# Patient Record
Sex: Female | Born: 1985 | Race: White | Hispanic: No | Marital: Single
Health system: Southern US, Community
[De-identification: ages and names within clinical notes are randomized; demographics above are authoritative.]

## PROBLEM LIST (undated history)

## (undated) DIAGNOSIS — Z808 Family history of malignant neoplasm of other organs or systems: Secondary | ICD-10-CM

## (undated) DIAGNOSIS — Z8042 Family history of malignant neoplasm of prostate: Secondary | ICD-10-CM

## (undated) DIAGNOSIS — Z8543 Personal history of malignant neoplasm of ovary: Secondary | ICD-10-CM

## (undated) HISTORY — DX: Family history of malignant neoplasm of prostate: Z80.42

## (undated) HISTORY — DX: Personal history of malignant neoplasm of ovary: Z85.43

## (undated) HISTORY — DX: Family history of malignant neoplasm of other organs or systems: Z80.8

---
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
## 2003-04-11 ENCOUNTER — Ambulatory Visit (HOSPITAL_COMMUNITY): Admission: RE | Admit: 2003-04-11 | Discharge: 2003-04-11 | Payer: Self-pay | Admitting: *Deleted

## 2004-04-08 ENCOUNTER — Ambulatory Visit: Payer: Self-pay | Admitting: Pediatrics

## 2005-07-28 ENCOUNTER — Ambulatory Visit: Payer: Self-pay | Admitting: Pediatrics

## 2009-12-21 ENCOUNTER — Ambulatory Visit (HOSPITAL_COMMUNITY)
Admission: RE | Admit: 2009-12-21 | Discharge: 2009-12-21 | Payer: Self-pay | Source: Home / Self Care | Admitting: Specialist

## 2015-01-20 ENCOUNTER — Inpatient Hospital Stay
Admission: RE | Admit: 2015-01-20 | Discharge: 2015-01-20 | Disposition: A | Discharge status: Home / Self Care | Payer: Self-pay | Source: Ambulatory Visit | Attending: Otolaryngology | Admitting: Otolaryngology

## 2015-01-20 ENCOUNTER — Other Ambulatory Visit (HOSPITAL_COMMUNITY): Payer: Self-pay | Admitting: Otolaryngology

## 2015-01-20 DIAGNOSIS — J320 Chronic maxillary sinusitis: Secondary | ICD-10-CM

## 2017-09-14 ENCOUNTER — Encounter: Payer: Self-pay | Admitting: Genetics

## 2017-09-14 ENCOUNTER — Telehealth: Payer: Self-pay | Admitting: Genetics

## 2017-09-14 NOTE — Telephone Encounter (Signed)
Spoke to the pt's mother, Bonnita Nasuti,  who says it is their daughter that needs the genetic counseling instead of Mr. Rotz. Pt has been scheduled to see Ferol Luz on 9/5 at 3pm. Mrs. Ohagan has been made aware that her daughter should arrive 30 minutes early to be checked in on time. She voiced understanding.

## 2017-10-05 ENCOUNTER — Inpatient Hospital Stay: Payer: BLUE CROSS/BLUE SHIELD

## 2017-10-05 ENCOUNTER — Inpatient Hospital Stay: Payer: BLUE CROSS/BLUE SHIELD | Attending: Genetic Counselor | Admitting: Genetics

## 2017-10-05 DIAGNOSIS — Z806 Family history of leukemia: Secondary | ICD-10-CM | POA: Diagnosis not present

## 2017-10-05 DIAGNOSIS — Z8543 Personal history of malignant neoplasm of ovary: Secondary | ICD-10-CM

## 2017-10-05 DIAGNOSIS — Z808 Family history of malignant neoplasm of other organs or systems: Secondary | ICD-10-CM | POA: Diagnosis not present

## 2017-10-05 DIAGNOSIS — Z315 Encounter for genetic counseling: Secondary | ICD-10-CM

## 2017-10-05 DIAGNOSIS — Z8042 Family history of malignant neoplasm of prostate: Secondary | ICD-10-CM | POA: Diagnosis not present

## 2017-10-05 DIAGNOSIS — Z8371 Family history of colonic polyps: Secondary | ICD-10-CM

## 2017-10-06 ENCOUNTER — Encounter: Payer: Self-pay | Admitting: Genetics

## 2017-10-06 DIAGNOSIS — Z8543 Personal history of malignant neoplasm of ovary: Secondary | ICD-10-CM | POA: Insufficient documentation

## 2017-10-06 DIAGNOSIS — Z8042 Family history of malignant neoplasm of prostate: Secondary | ICD-10-CM | POA: Insufficient documentation

## 2017-10-06 DIAGNOSIS — Z808 Family history of malignant neoplasm of other organs or systems: Secondary | ICD-10-CM | POA: Insufficient documentation

## 2017-10-06 NOTE — Progress Notes (Signed)
REFERRING PROVIDER: Self-referral  OBGYN PROVIDER:  Selinda Flavin, MD Fax: [phone] [facility]  PRIMARY REASON FOR VISIT:  1. Family history of leukemia   2. Personal history of ovarian cancer   3. Family history of prostate cancer   4. Family history of melanoma     HISTORY OF PRESENT ILLNESS:   Anne Hodges, a 32 y.o. female, was seen for a [facility] cancer genetics consultation due to a personal and family history of cancer.  Anne Hodges presents to clinic today to discuss the possibility of a hereditary predisposition to cancer, genetic testing, and to further clarify her future cancer risks, as well as potential cancer risks for family members.   At the age of 35, Anne Hodges was diagnosed with ovarian cancer.  She did not have her pathology report, but reported it was 'mucinous' and she believes it was her right ovary. She had her right ovary and fallopian tube removed.   HORMONAL RISK FACTORS:  Menarche was at age 29.  First live birth at age N/A.  OCP useyes  Ovaries intact: R ovary and fallopian tube removed, left intact.   Hysterectomy: no.  Menopausal status: reports she has had menopausal symptoms since the removal of her ovary.  HRT use: 0 years. Colonoscopy: no; not examined. Mammogram within the last year: no. Number of breast biopsies: 0.  She reports she did have lump on the skin of her breast removed, but this was not cancerous and was on the skin not in the breast.   Past Medical History:  Diagnosis Date  . Family history of melanoma   . Family history of prostate cancer   . Personal history of ovarian cancer     Social History   Socioeconomic History  . Marital status: Single    Spouse name: Not on file  . Number of children: Not on file  . Years of education: Not on file  . Highest education level: Not on file  Occupational History  . Not on file  Social Needs  . Financial resource strain: Not on file  . Food insecurity:    Worry: Not on  file    Inability: Not on file  . Transportation needs:    Medical: Not on file    Non-medical: Not on file  Tobacco Use  . Smoking status: Not on file  Substance and Sexual Activity  . Alcohol use: Not on file  . Drug use: Not on file  . Sexual activity: Not on file  Lifestyle  . Physical activity:    Days per week: Not on file    Minutes per session: Not on file  . Stress: Not on file  Relationships  . Social connections:    Talks on phone: Not on file    Gets together: Not on file    Attends religious service: Not on file    Active member of club or organization: Not on file    Attends meetings of clubs or organizations: Not on file    Relationship status: Not on file  Other Topics Concern  . Not on file  Social History Narrative  . Not on file     FAMILY HISTORY:  We obtained a detailed, 4-generation family history.  Significant diagnoses are listed below: Family History  Problem Relation Age of Onset  . Colon polyps Mother        a few precancerous, colonocsopy every 5 years  . Prostate cancer Father 82  gleason 7  . Colon polyps Maternal Grandmother        a cople prencancerous polyps, had colonocsopy every 5 years  . Diabetes Maternal Grandfather   . Melanoma Other        metastatic  . Other Other        blood disorder, maybe Ca?  . Leukemia Other     Anne Hodges has no children.  She has a 24 year-old sister with no history of cancer.  Her sister has her uterus and ovaries intact and has no history of cancer.   Anne Hodges father: dx with prostate cancer at 20, it is now Gleason 7 and he is planning to have a prostatectomy Paternal Aunts/Uncles: none, father was only child Paternal cousins: N/A Paternal grandfather: no history of cancer, died in his 90's Paternal grandmother:died at 10 with no history of cancer.  She had a sister (patient's great aunt) who died of metastatic melanoma. She has another sister (pateint's great aunt) who had a blood  disorder, possibly cancer?  Anne Hodges mother: 64, no history of cancer.  She has her ovaries intact, but uterus removed.   She has had a few precancerous colon polyps and has colonoscopies every 5 years. Maternal Aunts/Uncles: 1 paternal uncle is 38 with no history of cancer. 1 paternal aunt is 26 with no history of cancer.  Maternal cousins: no history of cancer, 1 maternal cousin has early onset cirrhosis of the liver, he does have significant alcohol use.  Maternal grandfather: died at 16 due to complications of Diabetes.  His mother had leukemia.  Maternal grandmother:died at 21 with no history of cancer.  She had some colon polyps throughout her life, and had colonoscopies every 5 years.  She had an emergency hysterectomy at 53, but the patient does not know the reason for this.   Anne Hodges is unaware of previous family history of genetic testing for hereditary cancer risks. Patient's maternal ancestors are of [place] descent, and paternal ancestors are of [place] descent. There is no reported Ashkenazi Jewish ancestry. There is no known consanguinity.  GENETIC COUNSELING ASSESSMENT: Anne Hodges is a 32 y.o. female with a personal and family history which is somewhat suggestive of a Hereditary Cancer Predisposition Syndrome. We, therefore, discussed and recommended the following at today's visit.   DISCUSSION: We reviewed the characteristics, features and inheritance patterns of hereditary cancer syndromes. We also discussed genetic testing, including the appropriate family members to test, the process of testing, insurance coverage and turn-around-time for results. We discussed the implications of a negative, positive and/or variant of uncertain significant result. We recommended Anne Hodges pursue genetic testing for the Multi-Cancer gene panel.   The Multi-Cancer Panel offered by [facility] includes sequencing and/or deletion duplication testing of the following 91 genes: AIP, ALK, APC,  ATM, AXIN2, BAP1, BARD1, BLM, BMPR1A, BRCA1, BRCA2, BRIP1, BUB1B, CASR, CDC73, CDH1, CDK4, CDKN1B, CDKN1C, CDKN2A, CEBPA, CEP57, CHEK2, CTNNA1, DICER1, DIS3L2, EGFR, ENG, EPCAM, FH, FLCN, GALNT12, GATA2, GPC3, GREM1, HOXB13, HRAS, KIT, MAX, MEN1, MET, MITF, MLH1, MLH3, MSH2, MSH3, MSH6, MUTYH, NBN, NF1, NF2, NTHL1, PALB2, PDGFRA, PHOX2B, PMS2, POLD1, POLE, POT1, PRKAR1A, PTCH1, PTEN, RAD50, RAD51C, RAD51D, RB1, RECQL4, RET, RNF43, RPS20, RUNX1, SDHA, SDHAF2, SDHB, SDHC, SDHD, SMAD4, SMARCA4, SMARCB1, SMARCE1, STK11, SUFU, TERC, TERT, TMEM127, TP53, TSC1, TSC2, VHL, WRN, WT1  One of the most common hereditary cancer syndromes that increases ovarian cancer risk is called Hereditary Breast and Ovarian Cancer (HBOC) syndrome.  This syndrome is caused by  mutations in the BRCA1 and BRCA2 genes.  This syndrome increases an individual's lifetime risk to develop breast, ovarian, pancreatic, and other types of cancer.  There are also many other cancer predisposition syndromes caused by mutations in several other genes.  We discussed that if she is found to have a mutation in one of these genes, it may impact future medical management recommendations such as increased cancer screenings and consideration of risk reducing surgeries.  A positive result could also have implications for the patient's family members.  A Negative result would mean we were unable to identify a hereditary component to her cancer, but does not rule out the possibility of a hereditary basis for her cancer.  There could be mutations that are undetectable by current technology, or in genes not yet tested or identified to increase cancer risk.    We discussed the potential to find a Variant of Uncertain Significance or VUS.  These are variants that have not yet been identified as pathogenic or benign, and it is unknown if this variant is associated with increased cancer risk or if this is a normal finding.  Most VUS's are reclassified to benign or  likely benign.   It should not be used to make medical management decisions. With time, we suspect the lab will determine the significance of any VUS's identified if any.   Based on Anne Hodges's personal and family history of cancer, she meets medical criteria for genetic testing. Despite that she meets criteria, she may still have an out of pocket cost. The laboratory can provide her with an estimate of her OOP cost.  she was given the contact information for the laboratory if she has further questions.   We discussed that some people do not want to undergo genetic testing due to fear of genetic discrimination.  A federal law called the Genetic Information Non-Discrimination Act (GINA) of 2008 helps protect individuals against genetic discrimination based on their genetic test results.  It impacts both health insurance and employment.  For health insurance, it protects against increased premiums, being kicked off insurance or being forced to take a test in order to be insured.  For employment it protects against hiring, firing and promoting decisions based on genetic test results.  Health status due to a cancer diagnosis is not protected under GINA.  This law does not protect life insurance, disability insurance, or other types of insurance.   PLAN: After considering the risks, benefits, and limitations, Anne Hodges  provided informed consent to pursue genetic testing and the blood sample was sent to [facility] for analysis of the Multi-Cancer Panel. Results should be available within approximately 2-3 weeks' time, at which point they will be disclosed by telephone to Anne Hodges, as will any additional recommendations warranted by these results. Anne Hodges will receive a summary of her genetic counseling visit and a copy of her results once available. This information will also be available in Epic. We encouraged Anne Hodges to remain in contact with cancer genetics annually so that we can  continuously update the family history and inform her of any changes in cancer genetics and testing that may be of benefit for her family. Anne Hodges questions were answered to her satisfaction today. Our contact information was provided should additional questions or concerns arise.  Lastly, we encouraged Anne Hodges to remain in contact with cancer genetics annually so that we can continuously update the family history and inform her of any changes in cancer genetics and  testing that may be of benefit for this family.   Ms.  Hodges questions were answered to her satisfaction today. Our contact information was provided should additional questions or concerns arise. Thank you for the referral and allowing [place] to share in the care of your patient.   Tana Felts, MS, [facility] Certified Genetic Counselor [email or web address] phone: [phone]  The patient was seen for a total of 40 minutes in face-to-face genetic counseling.  The patient was accompanied today by her mother and father.  This patient was discussed with Drs. Magrinat, Lindi Adie and/or Burr Medico who agrees with the above.

## 2017-10-24 ENCOUNTER — Telehealth: Payer: Self-pay | Admitting: Genetics

## 2017-11-01 ENCOUNTER — Encounter: Payer: Self-pay | Admitting: Genetics

## 2017-11-01 ENCOUNTER — Ambulatory Visit: Payer: Self-pay | Admitting: Genetics

## 2017-11-01 DIAGNOSIS — Z8042 Family history of malignant neoplasm of prostate: Secondary | ICD-10-CM

## 2017-11-01 DIAGNOSIS — Z1379 Encounter for other screening for genetic and chromosomal anomalies: Secondary | ICD-10-CM | POA: Insufficient documentation

## 2017-11-01 DIAGNOSIS — Z8543 Personal history of malignant neoplasm of ovary: Secondary | ICD-10-CM

## 2017-11-01 DIAGNOSIS — Z806 Family history of leukemia: Secondary | ICD-10-CM

## 2017-11-01 DIAGNOSIS — Z808 Family history of malignant neoplasm of other organs or systems: Secondary | ICD-10-CM

## 2017-11-01 NOTE — Progress Notes (Signed)
HPI:  Anne Hodges was previously seen in the [place] clinic on 10/05/2017 due to a personal and family history of cancer and concerns regarding a hereditary predisposition to cancer. Please refer to our prior cancer genetics clinic note for more information regarding Anne Hodges's medical, social and family histories, and our assessment and recommendations, at the time. Anne Hodges recent genetic test results were disclosed to her, as well as recommendations warranted by these results. These results and recommendations are discussed in more detail below.  CANCER HISTORY:   No history exists.     FAMILY HISTORY:  We obtained a detailed, 4-generation family history.  Significant diagnoses are listed below: Family History  Problem Relation Age of Onset  . Colon polyps Mother        a few precancerous, colonocsopy every 5 years  . Prostate cancer Father 61       gleason 7  . Basal cell carcinoma Father        nose  . Colon polyps Maternal Grandmother        a cople prencancerous polyps, had colonocsopy every 5 years  . Diabetes Maternal Grandfather   . Melanoma Other        metastatic  . Other Other        blood disorder, maybe Ca?  . Leukemia Other     Anne Hodges has no children.  She has a 71 year-old sister with no history of cancer.  Her sister has her uterus and ovaries intact and has no history of cancer.   Anne Hodges father: dx with prostate cancer at 67, it is now Gleason 7 and he is planning to have a prostatectomy Paternal Aunts/Uncles: none, father was only child Paternal cousins: N/A Paternal grandfather: no history of cancer, died in his 73's Paternal grandmother:died at 67 with no history of cancer.  She had a sister (patient's great aunt) who died of metastatic melanoma. She has another sister (pateint's great aunt) who had a blood disorder, possibly cancer?  Anne Hodges mother: 4, no history of cancer.  She has her ovaries intact, but  uterus removed.   She has had a few precancerous colon polyps and has colonoscopies every 5 years. Maternal Aunts/Uncles: 1 paternal uncle is 12 with no history of cancer. 1 paternal aunt is 27 with no history of cancer.  Maternal cousins: no history of cancer, 1 maternal cousin has early onset cirrhosis of the liver, he does have significant alcohol use.  Maternal grandfather: died at 78 due to complications of Diabetes.  His mother had leukemia.  Maternal grandmother:died at 71 with no history of cancer.  She had some colon polyps throughout her life, and had colonoscopies every 5 years.  She had an emergency hysterectomy at 13, but the patient does not know the reason for this.   Anne Hodges is unaware of previous family history of genetic testing for hereditary cancer risks. Patient's maternal ancestors are of [place] descent, and paternal ancestors are of [place] descent. There is no reported Ashkenazi Jewish ancestry. There is no known consanguinity.  GENETIC TEST RESULTS: Genetic testing performed through [facility]'s Multi-Cancer Panel reported out on 10/21/2017 showed no pathogenic mutations. The Multi-Cancer Panel offered by [facility] includes sequencing and/or deletion duplication testing of the following 91 genes: AIP, ALK, APC, ATM, AXIN2, BAP1, BARD1, BLM, BMPR1A, BRCA1, BRCA2, BRIP1, BUB1B, CASR, CDC73, CDH1, CDK4, CDKN1B, CDKN1C, CDKN2A, CEBPA, CEP57, CHEK2, CTNNA1, DICER1, DIS3L2, EGFR, ENG, EPCAM, FH, FLCN, GALNT12, GATA2, GPC3, GREM1,  HOXB13, HRAS, KIT, MAX, MEN1, MET, MITF, MLH1, MLH3, MSH2, MSH3, MSH6, MUTYH, NBN, NF1, NF2, NTHL1, PALB2, PDGFRA, PHOX2B, PMS2, POLD1, POLE, POT1, PRKAR1A, PTCH1, PTEN, RAD50, RAD51C, RAD51D, RB1, RECQL4, RET, RNF43, RPS20, RUNX1, SDHA, SDHAF2, SDHB, SDHC, SDHD, SMAD4, SMARCA4, SMARCB1, SMARCE1, STK11, SUFU, TERC, TERT, TMEM127, TP53, TSC1, TSC2, VHL, WRN, WT1  The test report will be scanned into EPIC and will be located under the Molecular Pathology section of  the Results Review tab. A portion of the result report is included below for reference.     We discussed with Anne Hodges that because current genetic testing is not perfect, it is possible there may be a gene mutation in one of these genes that current testing cannot detect, but that chance is small.  We also discussed, that there could be another gene that has not yet been discovered, or that we have not yet tested, that is responsible for the cancer diagnoses in the family. It is also possible there is a hereditary cause for the cancer in the family that Anne Hodges did not inherit and therefore was not identified in her testing.  Therefore, it is important to remain in touch with cancer genetics in the future so that we can continue to offer Anne Hodges the most up to date genetic testing.    ADDITIONAL GENETIC TESTING: We discussed with Anne Hodges that her genetic testing was fairly extensive.  If there are are genes identified to increase cancer risk that can be analyzed in the future, we would be happy to discuss and coordinate this testing at that time.    CANCER SCREENING RECOMMENDATIONS: Anne Hodges test result is considered negative (normal).  This means that we have not identified a hereditary cause for her personal and family history of cancer at this time.   While reassuring, this does not definitively rule out a hereditary predisposition to cancer. It is still possible that there could be genetic mutations that are undetectable by current technology, or genetic mutations in genes that have not been tested or identified to increase cancer risk.  Therefore, it is recommended she continue to follow the cancer management and screening guidelines provided by her oncology and primary healthcare provider. An individual's cancer risk is not determined by genetic test results alone.  Overall cancer risk assessment includes additional factors such as personal medical history, family history,  etc.  These should be used to make a personalized plan for cancer prevention and surveillance.    RECOMMENDATIONS FOR FAMILY MEMBERS:  Relatives in this family might be at some increased risk of developing cancer, over the general population risk, simply due to the family history of cancer.  We recommended women in this family have a yearly mammogram beginning at age 110, or 43 years younger than the earliest onset of cancer, an annual clinical breast exam, and perform monthly breast self-exams. Women in this family should also have a gynecological exam as recommended by their primary provider. All family members should have a colonoscopy by age 35 (or as directed by their doctors).  All family members should inform their physicians about the family history of cancer so their doctors can make the most appropriate screening recommendations for them.   FOLLOW-UP: Lastly, we discussed with Anne Hodges that cancer genetics is a rapidly advancing field and it is possible that new genetic tests will be appropriate for her and/or her family members in the future. We encouraged her to remain in contact with cancer genetics on an  annual basis so we can update her personal and family histories and let her know of advances in cancer genetics that may benefit this family.   Our contact number was provided. Anne Hodges questions were answered to her satisfaction, and she knows she is welcome to call us at anytime with additional questions or concerns.   Ferol Luz, MS, [facility] Certified Genetic Counselor [email or web address]

## 2017-11-13 NOTE — Telephone Encounter (Signed)
Revealed negative genetic testing.   This normal result is reassuring and indicates that it is unlikely Anne Hodges's cancer is due to a hereditary cause.  It is unlikely that there is an increased risk of another cancer due to a mutation in one of these genes.  However, genetic testing is not perfect, and cannot definitively rule out a hereditary cause.  It will be important for her to keep in contact with genetics to learn if any additional testing may be needed in the future.

## 2018-03-21 ENCOUNTER — Other Ambulatory Visit (HOSPITAL_COMMUNITY): Payer: Self-pay | Admitting: Obstetrics and Gynecology

## 2018-03-21 DIAGNOSIS — Z86018 Personal history of other benign neoplasm: Secondary | ICD-10-CM

## 2018-03-21 DIAGNOSIS — D279 Benign neoplasm of unspecified ovary: Secondary | ICD-10-CM

## 2018-03-30 ENCOUNTER — Ambulatory Visit (HOSPITAL_COMMUNITY)
Admission: RE | Admit: 2018-03-30 | Discharge: 2018-03-30 | Disposition: A | Discharge status: Home / Self Care | Payer: BLUE CROSS/BLUE SHIELD | Source: Ambulatory Visit | Attending: Obstetrics and Gynecology | Admitting: Obstetrics and Gynecology

## 2018-03-30 DIAGNOSIS — D279 Benign neoplasm of unspecified ovary: Secondary | ICD-10-CM | POA: Diagnosis present

## 2018-03-30 DIAGNOSIS — Z86018 Personal history of other benign neoplasm: Secondary | ICD-10-CM | POA: Diagnosis present

## 2020-06-28 IMAGING — US US TRANSVAGINAL NON-OB
1 series · 15 of 25 positions shown · non-contrast
Comparison: None.

CLINICAL DATA: 32-year-old female with a history of right
oophorectomy for mucinous adenofibroma of the right ovary in 7087,
now presenting for follow-up of reported suspected hemorrhagic left
ovarian cyst on recent outside pelvic sonogram study. Uncertain LMP,
best estimate 02/16/2018. No current pelvic symptoms are reported.

EXAM:
ULTRASOUND PELVIS TRANSVAGINAL
TECHNIQUE: Transvaginal ultrasound examination of the pelvis was performed
including evaluation of the uterus, ovaries, adnexal regions, and
pelvic cul-de-sac.

[Series 1: us transvaginal non-ob · 15 of 69 slices shown]
[im 1/69]
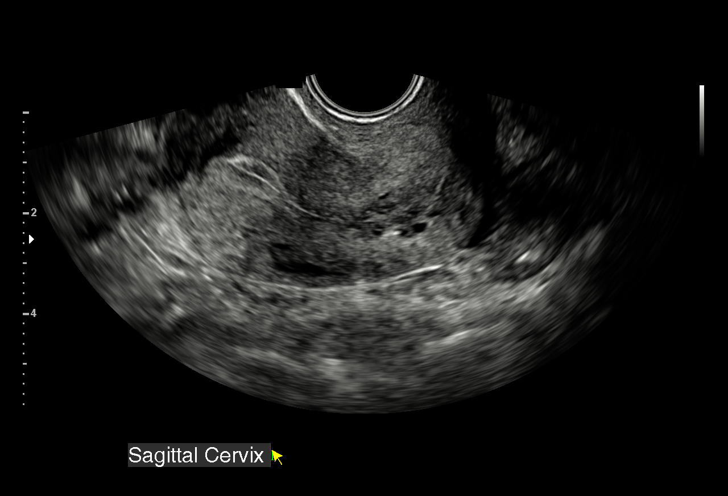
[im 6/69]
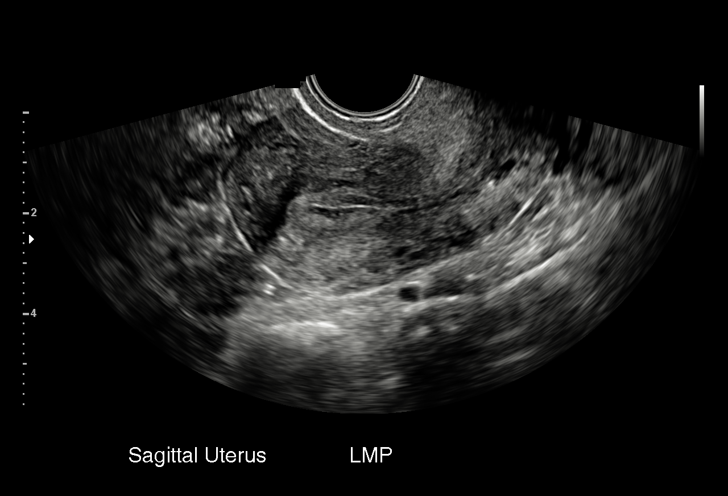
[im 12/69]
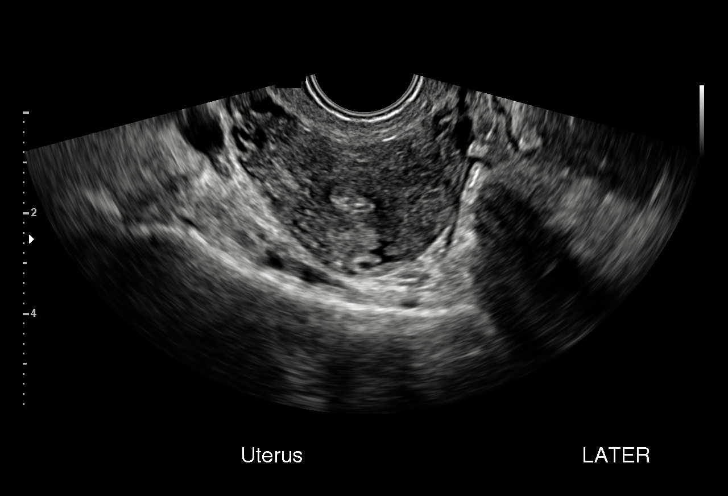
[im 15/69]
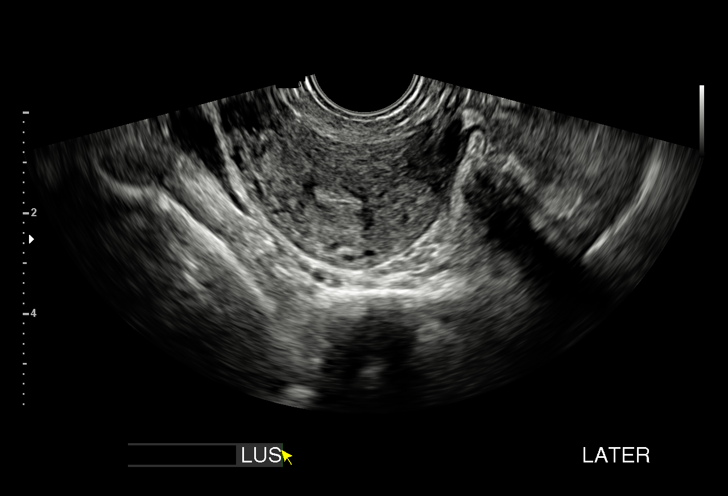
[im 20/69]
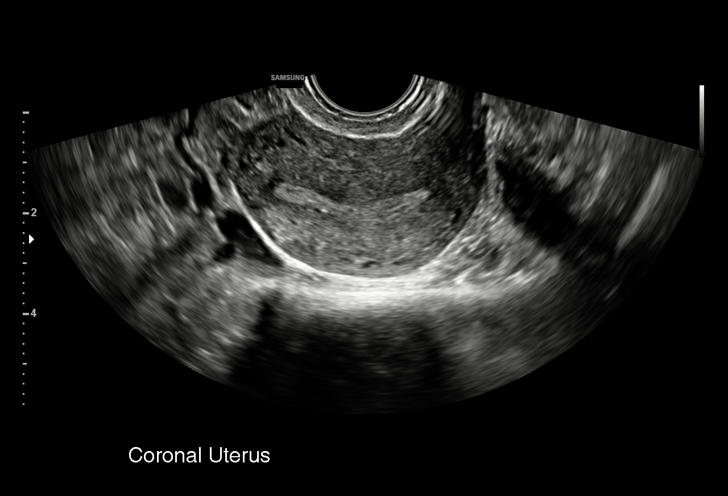
[im 26/69]
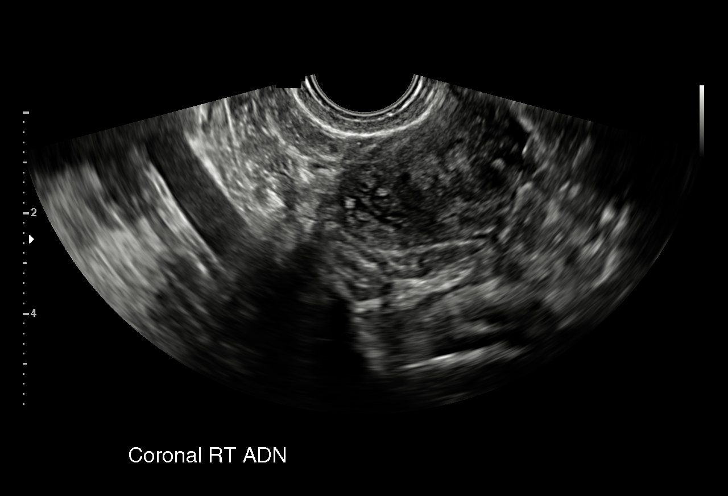
[im 29/69]
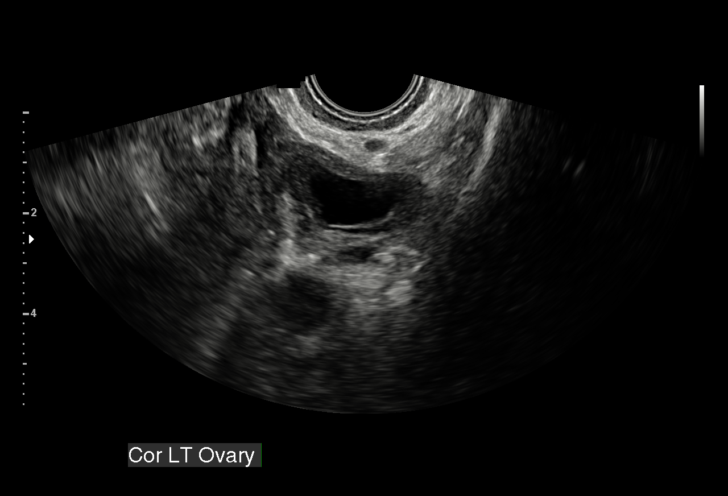
[im 35/69]
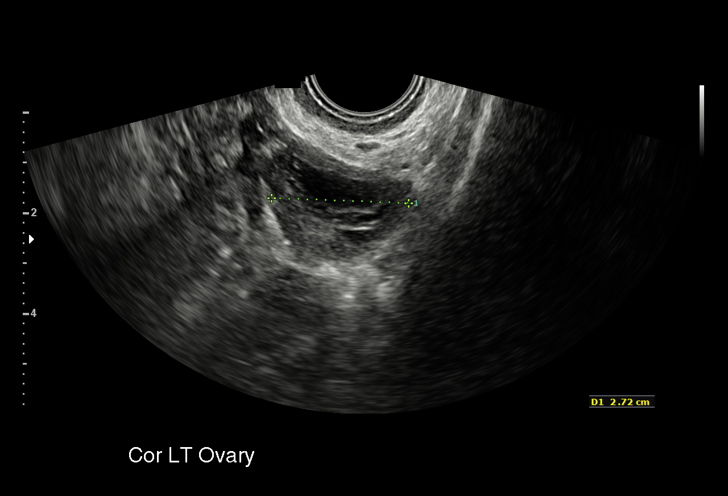
[im 40/69]
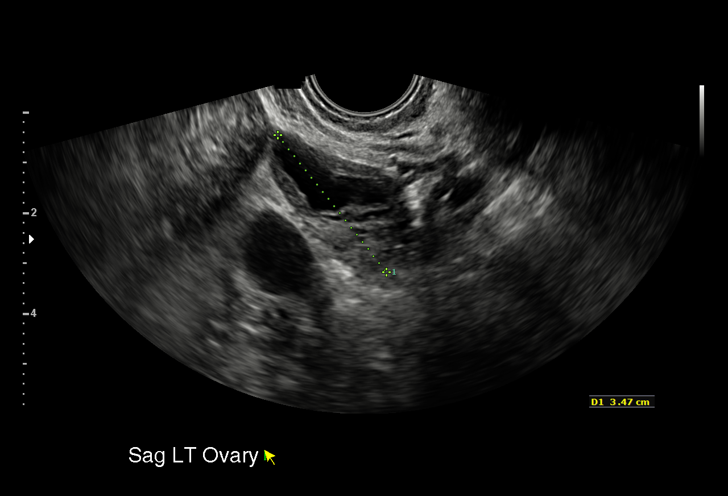
[im 43/69]
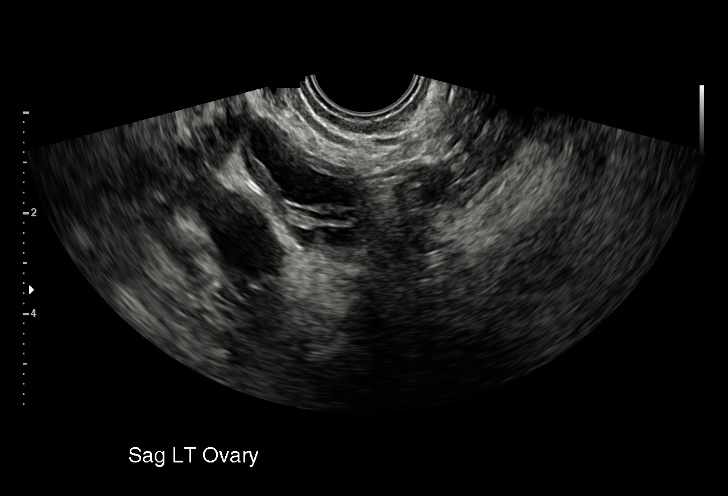
[im 49/69]
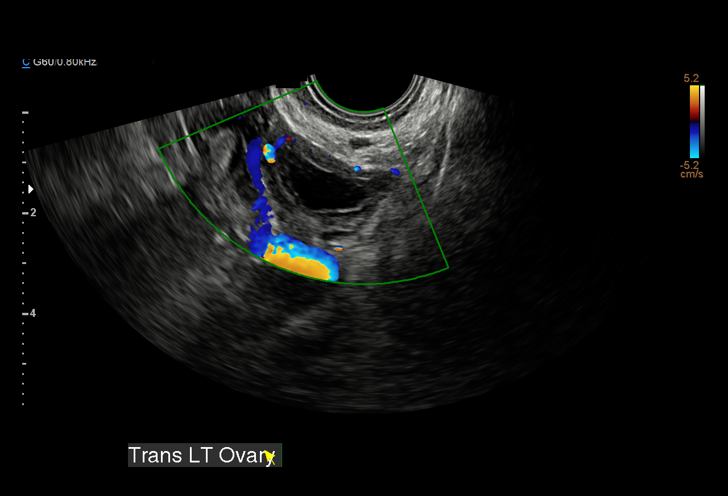
[im 54/69]
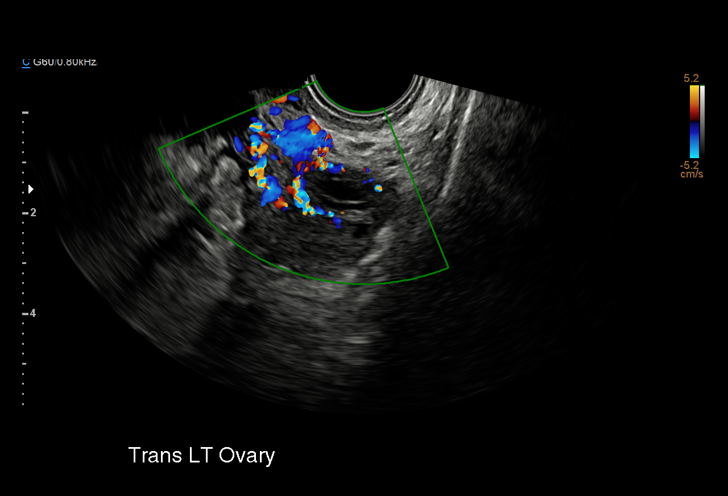
[im 57/69]
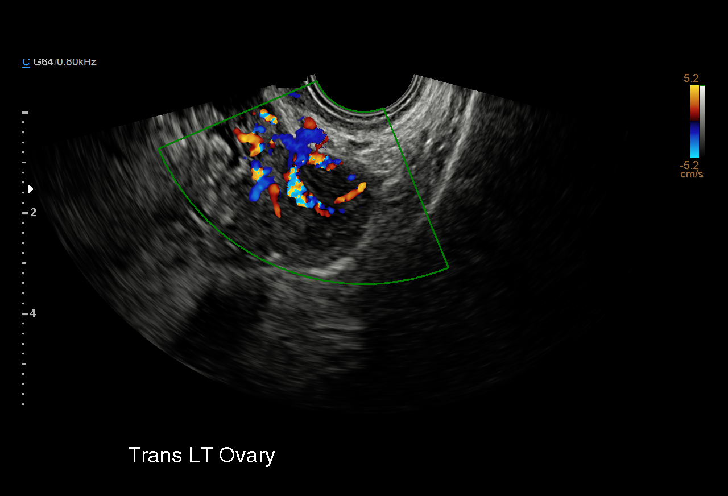
[im 63/69]
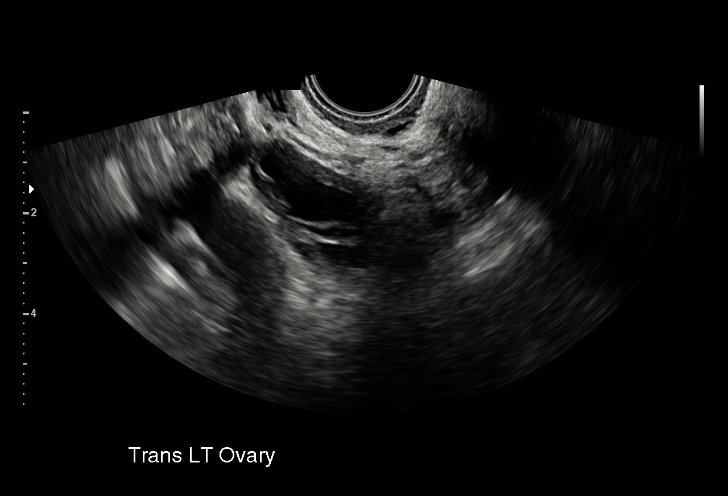
[im 69/69]
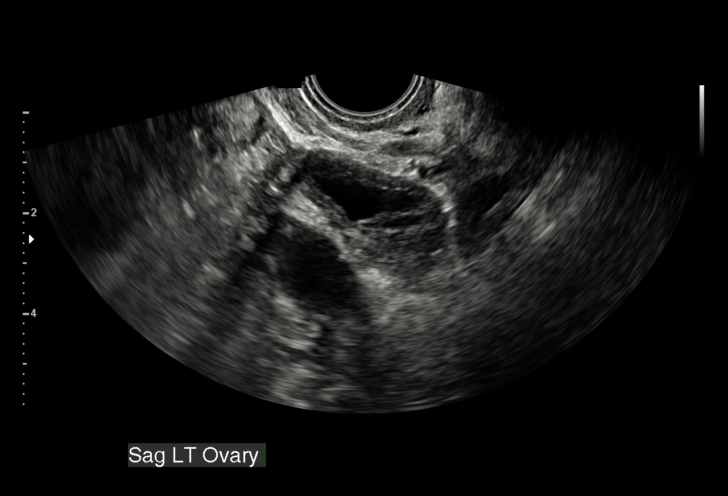

[15 of 25 positions shown; findings below may reference images not displayed]

FINDINGS: Uterus

Measurements: 7.2 x 3.3 x 5.0 cm = volume: 62 mL. Anteverted uterus
is normal in size and configuration, with no uterine fibroids or
other myometrial abnormalities.

Endometrium

Thickness: 7 mm. No endometrial cavity fluid or focal endometrial
mass.

Right ovary

Surgically resected.  No right adnexal mass demonstrated.

Left ovary

Measurements: 3.5 x 2.0 x 2.7 cm = volume: 9.9 mL. There is a
centrally anechoic 2.2 x 0.8 x 1.6 cm (volume = 1 cm^3) cystic
left ovarian structure with peripheral mural hypervascularity on
color Doppler and with crenulated contour, compatible with a
collapsing corpus luteal cyst. No suspicious left ovarian or left
adnexal masses.

Other findings:  No abnormal free fluid
IMPRESSION: 1. Collapsing corpus luteal cyst in the left ovary. No suspicious
left ovarian or left adnexal masses.
2. Right oophorectomy.  No right adnexal mass.
3. Normal anteverted uterus.
4. No free fluid in the pelvis.
